# Patient Record
Sex: Male | Born: 1940 | Race: White | Hispanic: No | Marital: Single
Health system: Southern US, Community
[De-identification: ages and names within clinical notes are randomized; demographics above are authoritative.]

## PROBLEM LIST (undated history)

## (undated) DIAGNOSIS — N289 Disorder of kidney and ureter, unspecified: Secondary | ICD-10-CM

---

## 2008-07-18 ENCOUNTER — Ambulatory Visit: Payer: Self-pay | Admitting: Unknown Physician Specialty

## 2011-06-16 ENCOUNTER — Emergency Department: Payer: Self-pay | Admitting: Emergency Medicine

## 2011-07-02 ENCOUNTER — Ambulatory Visit: Payer: Self-pay | Admitting: Nephrology

## 2011-07-27 ENCOUNTER — Inpatient Hospital Stay: Payer: Self-pay | Admitting: Internal Medicine

## 2011-08-23 ENCOUNTER — Emergency Department (HOSPITAL_COMMUNITY)
Admission: EM | Admit: 2011-08-23 | Discharge: 2011-09-04 | Disposition: E | Payer: Medicare Other | Attending: Emergency Medicine | Admitting: Emergency Medicine

## 2011-08-23 ENCOUNTER — Encounter (HOSPITAL_COMMUNITY): Payer: Self-pay | Admitting: Emergency Medicine

## 2011-08-23 DIAGNOSIS — I469 Cardiac arrest, cause unspecified: Secondary | ICD-10-CM

## 2011-08-23 DIAGNOSIS — E119 Type 2 diabetes mellitus without complications: Secondary | ICD-10-CM | POA: Insufficient documentation

## 2011-08-23 HISTORY — DX: Disorder of kidney and ureter, unspecified: N28.9

## 2011-09-04 NOTE — ED Notes (Signed)
Family in room with pt

## 2011-09-04 NOTE — ED Notes (Signed)
Pt pronounced DOA.

## 2011-09-04 NOTE — ED Provider Notes (Signed)
History     CSN: [id number]  Arrival date & time 08/05/2011  1949   First MD Initiated Contact with Patient 09/01/2011 1956      Chief Complaint  Patient presents with  . Cardiac Arrest    (Consider location/radiation/quality/duration/timing/severity/associated sxs/prior treatment) HPI This 71 year old male has a history of diabetes and is usually cared for over in [place].  The patient had a witnessed collapse in the kitchen tonight and was found asystolic by EMS and first responders and the patient has remained asystolic despite over one hour of advanced cardiac life support with a King airway in place continuous compressions and multiple rounds of epinephrine.  The patient was pronounced dead upon arrival with asystole confirmed pupils fixed and dilated no spontaneous respirations no heart sounds no movement no pulses. Past Medical History  Diagnosis Date  . Diabetes mellitus   . Renal disorder    Diabetes History reviewed. No pertinent past surgical history.  No family history on file.  History  Substance Use Topics  . Smoking status: Not on file  . Smokeless tobacco: Not on file  . Alcohol Use:      unknown      Review of Systems  Unable to perform ROS: Other  Cardiac Arrest  Allergies  Sulfa antibiotics  Home Medications  No current outpatient prescriptions on file.  There were no vitals taken for this visit.  Physical Exam General Appearance: 71 year old obese unresponsive male with CPR in progress upon arrival Skin mottled HEENT exam: Pupils fixed and dilated absent corneal reflex absent gag reflex king airway is in place Neck:Supple Lungs: Bilateral breath sounds only with King airway ventilations, absent spontaneous respirations Cardiac: Chest compressions in progress upon arrival with asystole on the cardiac monitor with absent heart sounds are compressions stopped Abdomen:Obese, soft Ext: Flaccid, unresponsive Neuro:[facility] 3 ED Course  Procedures  (including critical care time) EMS care reviewed, asystole confirmed multiple leads, Pt pronounced DOA; d/w family and PCP; PCP will sign death certificate. Labs Reviewed - No data to display No results found.   1. Cardiac arrest - asystole    DOA   MDM          Hurman Horn, MD 08/11/2011 2355

## 2011-09-04 NOTE — ED Notes (Signed)
Patient pronounced DOA by Dr. Fonnie Jarvis.

## 2011-09-04 NOTE — ED Notes (Signed)
Patient was witness collapse in his kitchen at 1845; family started CPR within 5 minutes.  EMS continued CPR and intubation en route.  Patient was given Epi x 8 and Atropine x 3.  Patient asystole on monitor; Dr. Fonnie Jarvis at bedside on arrival.  Compressions continue.

## 2011-09-04 DEATH — deceased

## 2013-07-08 IMAGING — CT CT HEAD WITHOUT CONTRAST
2 series · 15 of 30 positions shown, 19 images · non-contrast
Comparison: none

REASON FOR EXAM: fall/syncope; pt on coumadin
COMMENTS:

[Series 2: without · axial · non-contrast · 0.47mm/px · z∈[+921,+1066]mm · 13 of 35 slices shown, 17 images]
[im 3/35  brain]
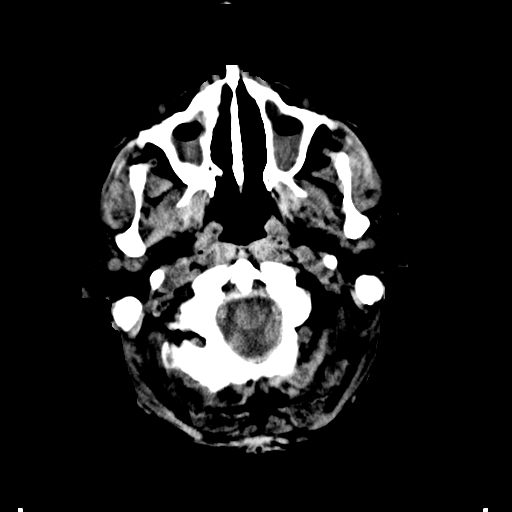
[im 3/35  bone]
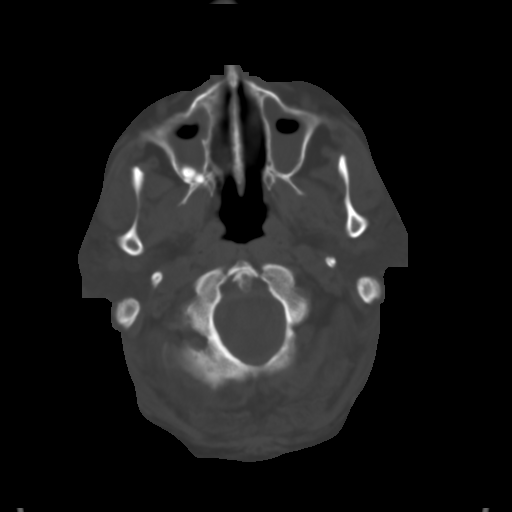
[im 5/35  brain]
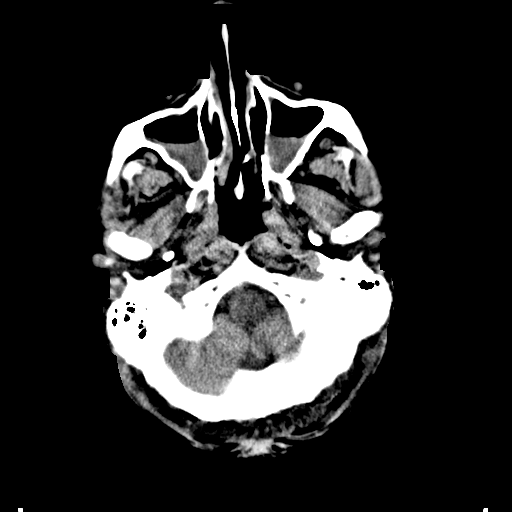
[im 8/35  brain]
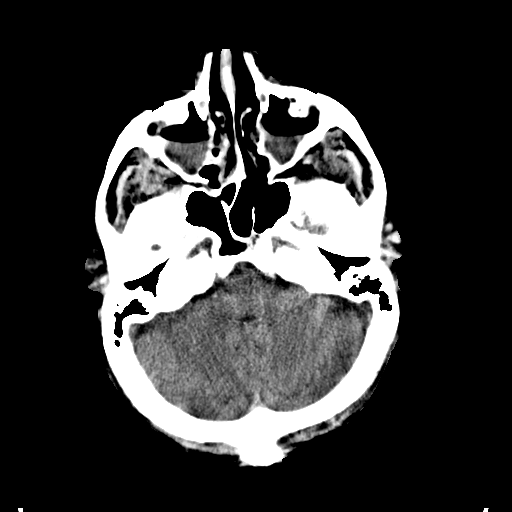
[im 10/35  brain]
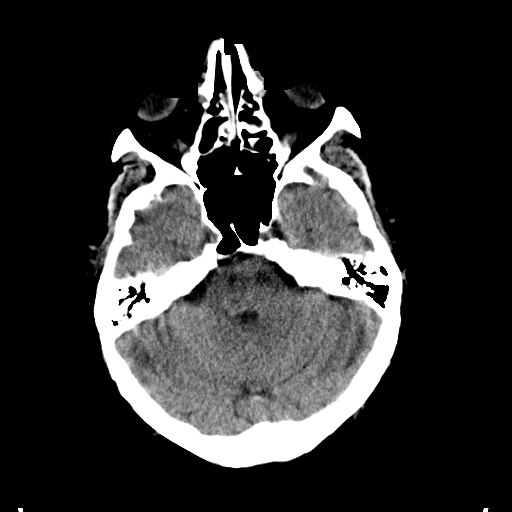
[im 13/35  brain]
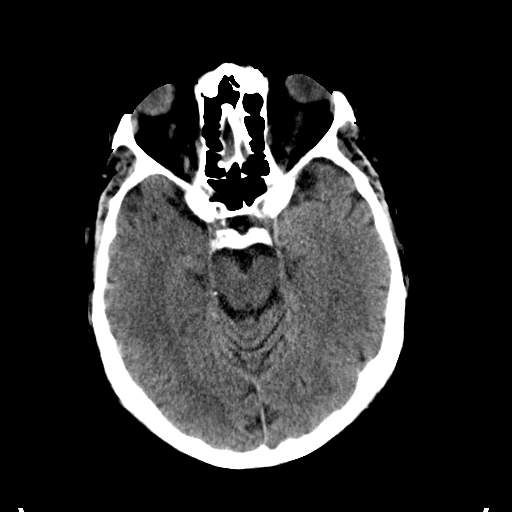
[im 13/35  bone]
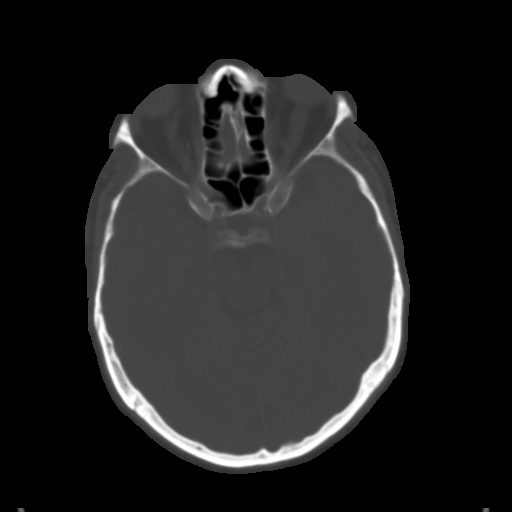
[im 15/35  brain]
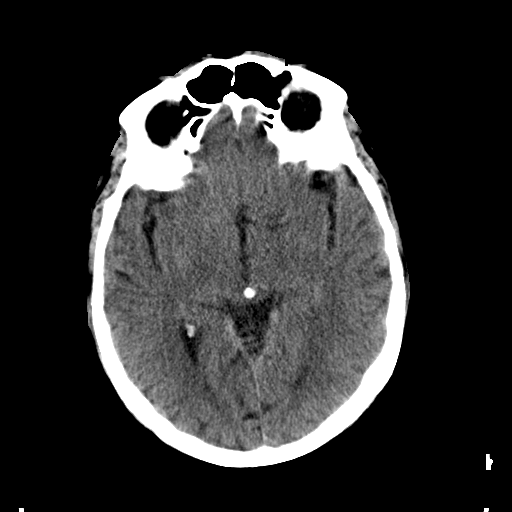
[im 18/35  brain]
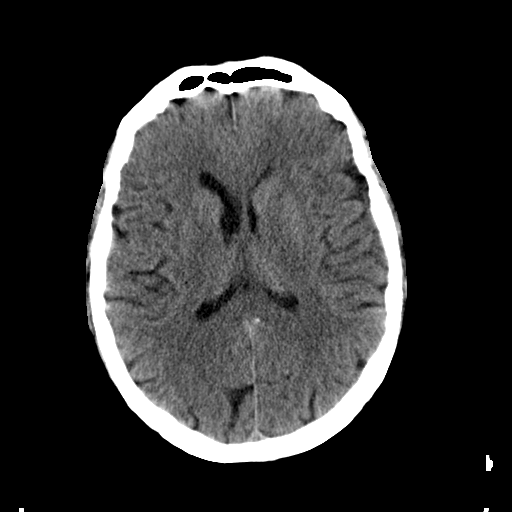
[im 20/35  brain]
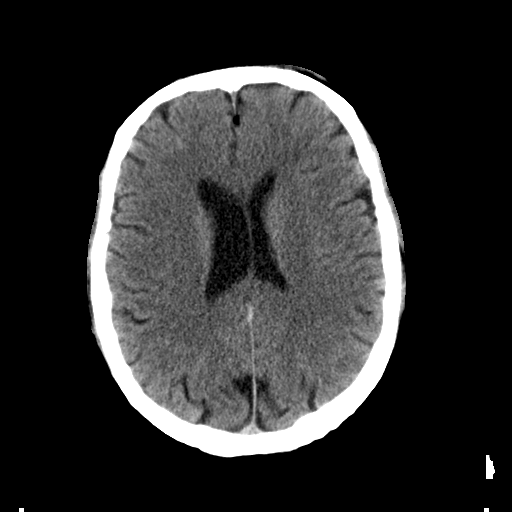
[im 22/35  brain]
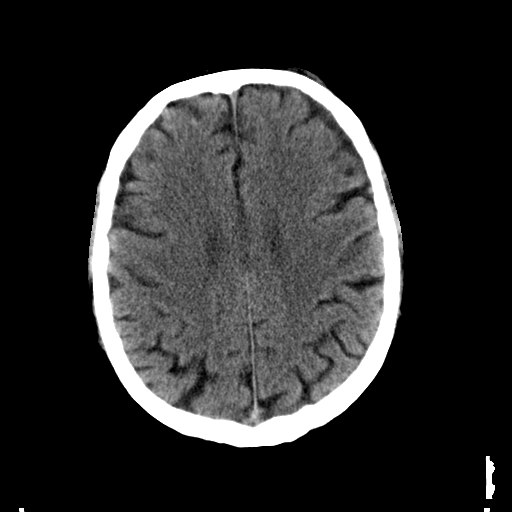
[im 22/35  bone]
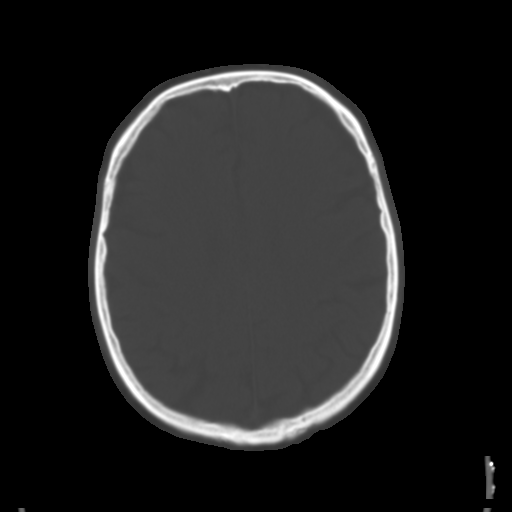
[im 25/35  brain]
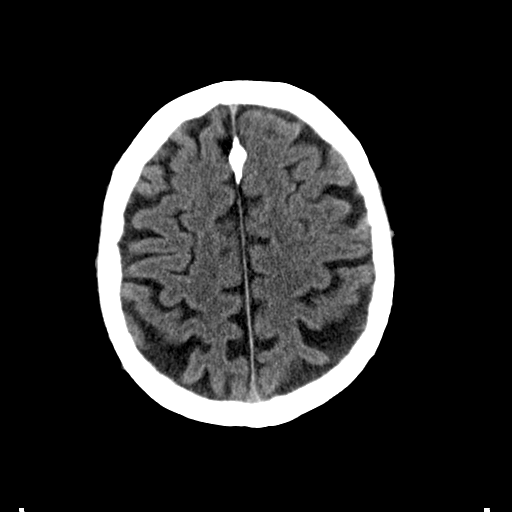
[im 27/35  brain]
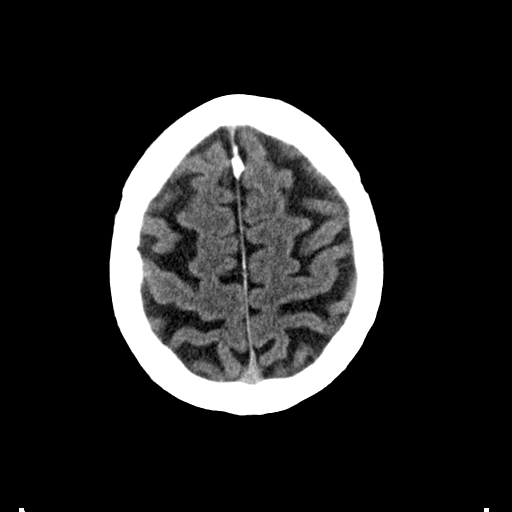
[im 30/35  brain]
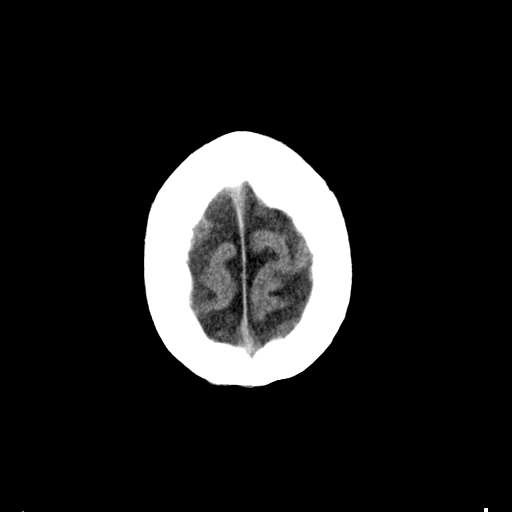
[im 32/35  brain]
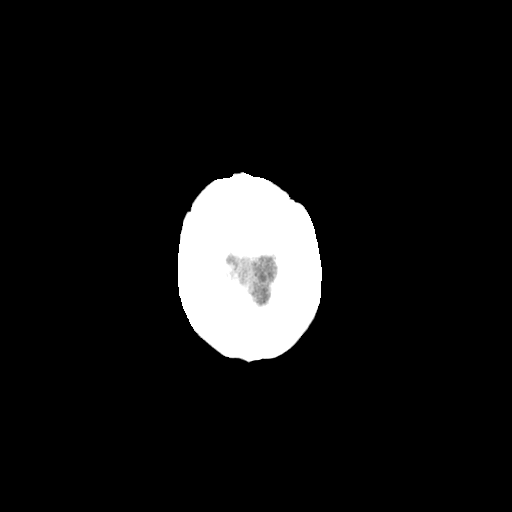
[im 32/35  bone]
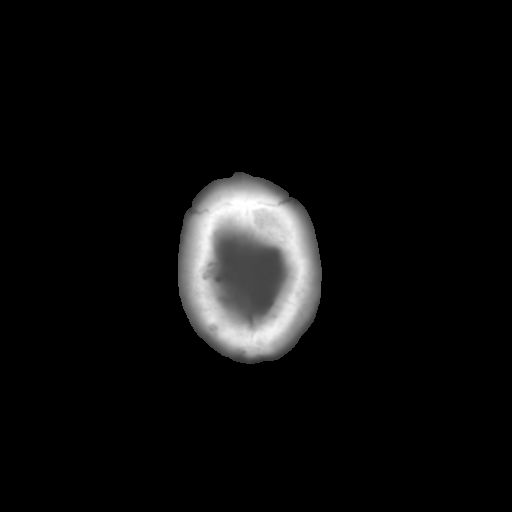

[Series 3: bone · axial · 0.47mm/px · z∈[+921,+946]mm · 2 of 35 slices shown]
[im 3/35  bone]
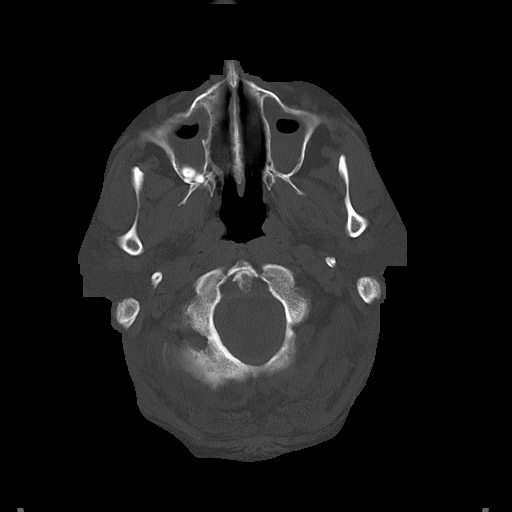
[im 8/35  bone]
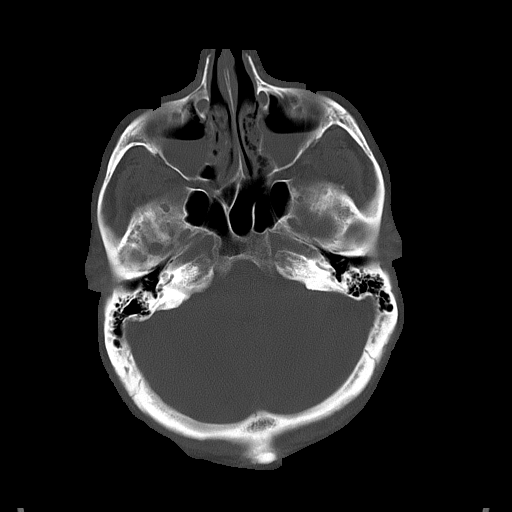

[15 of 30 positions shown; findings below may reference images not displayed]

PROCEDURE:     CT  - CT HEAD WITHOUT CONTRAST  - July 27, 2011 [DATE]

RESULT:     Axial noncontrast CT scanning was performed through the brain
with reconstructions at 5 mm intervals and slice thicknesses.

The ventricles are normal in size and position. There is no intracranial
hemorrhage nor intracranial mass effect. The cerebellum and brainstem are
normal in density.

At bone window settings there are air-fluid levels which exhibit Hounsfield
measurements of approximately 30 to 45. There are air-fluid levels in the
maxillary sinuses. There is mucoperiosteal thickening within the ethmoid
sinuses. I do not see evidence of fractures of the sinus walls. The nasal
bones are intact. There is no evidence of an acute skull fracture.
IMPRESSION: 1. I do not see evidence of an acute intracranial hemorrhage.
2. There are air-fluid levels in the maxillary sinuses and there is
mucoperiosteal thickening within the ethmoid sinuses. The findings are most
likely due to sinus inflammation.

## 2013-07-12 IMAGING — CR DG CHEST 2V
1 series · 3 of 3 positions shown · non-contrast
Comparison: none

REASON FOR EXAM: CHF
COMMENTS:

PROCEDURE:     DXR - DXR CHEST PA (OR AP) AND LATERAL  - July 31, 2011 [DATE]
RESULT:     Comparison: 07/27/2011

[Series 1: pa · 0.17mm/px · 3 of 3 slices shown]
[im 1/3]
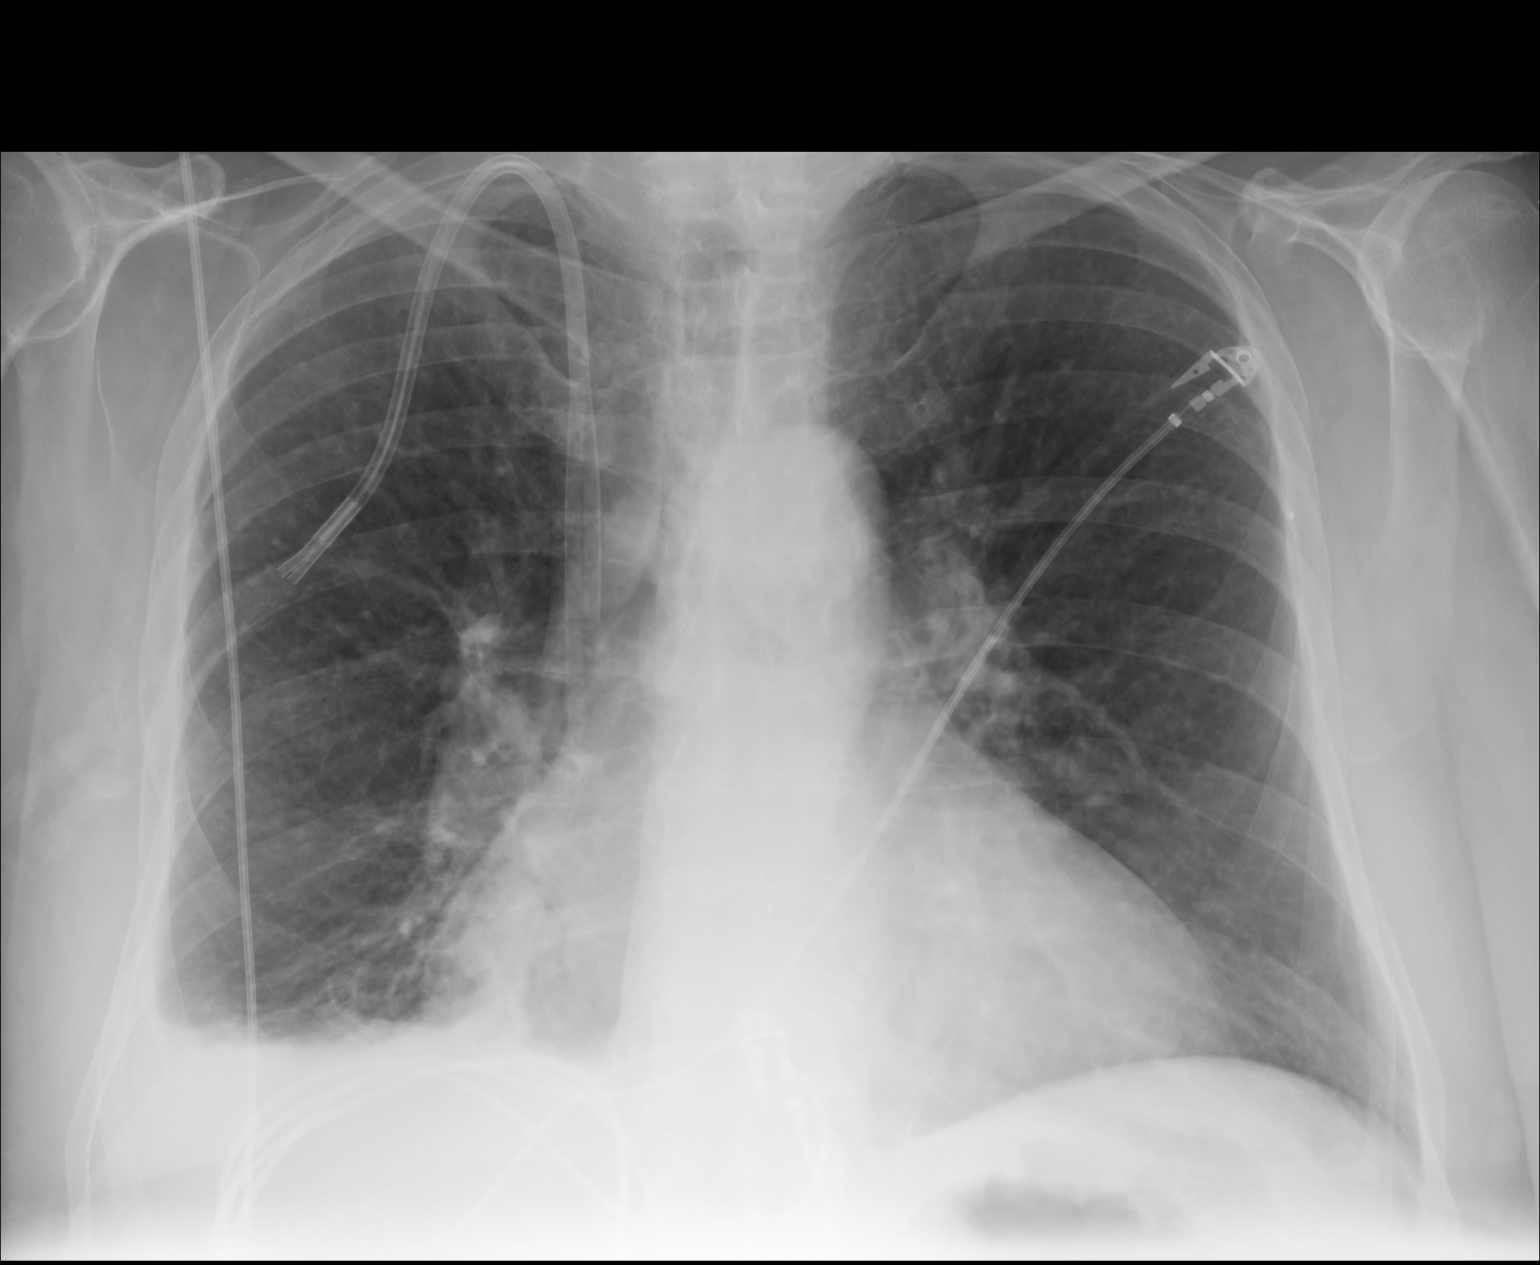
[im 2/3]
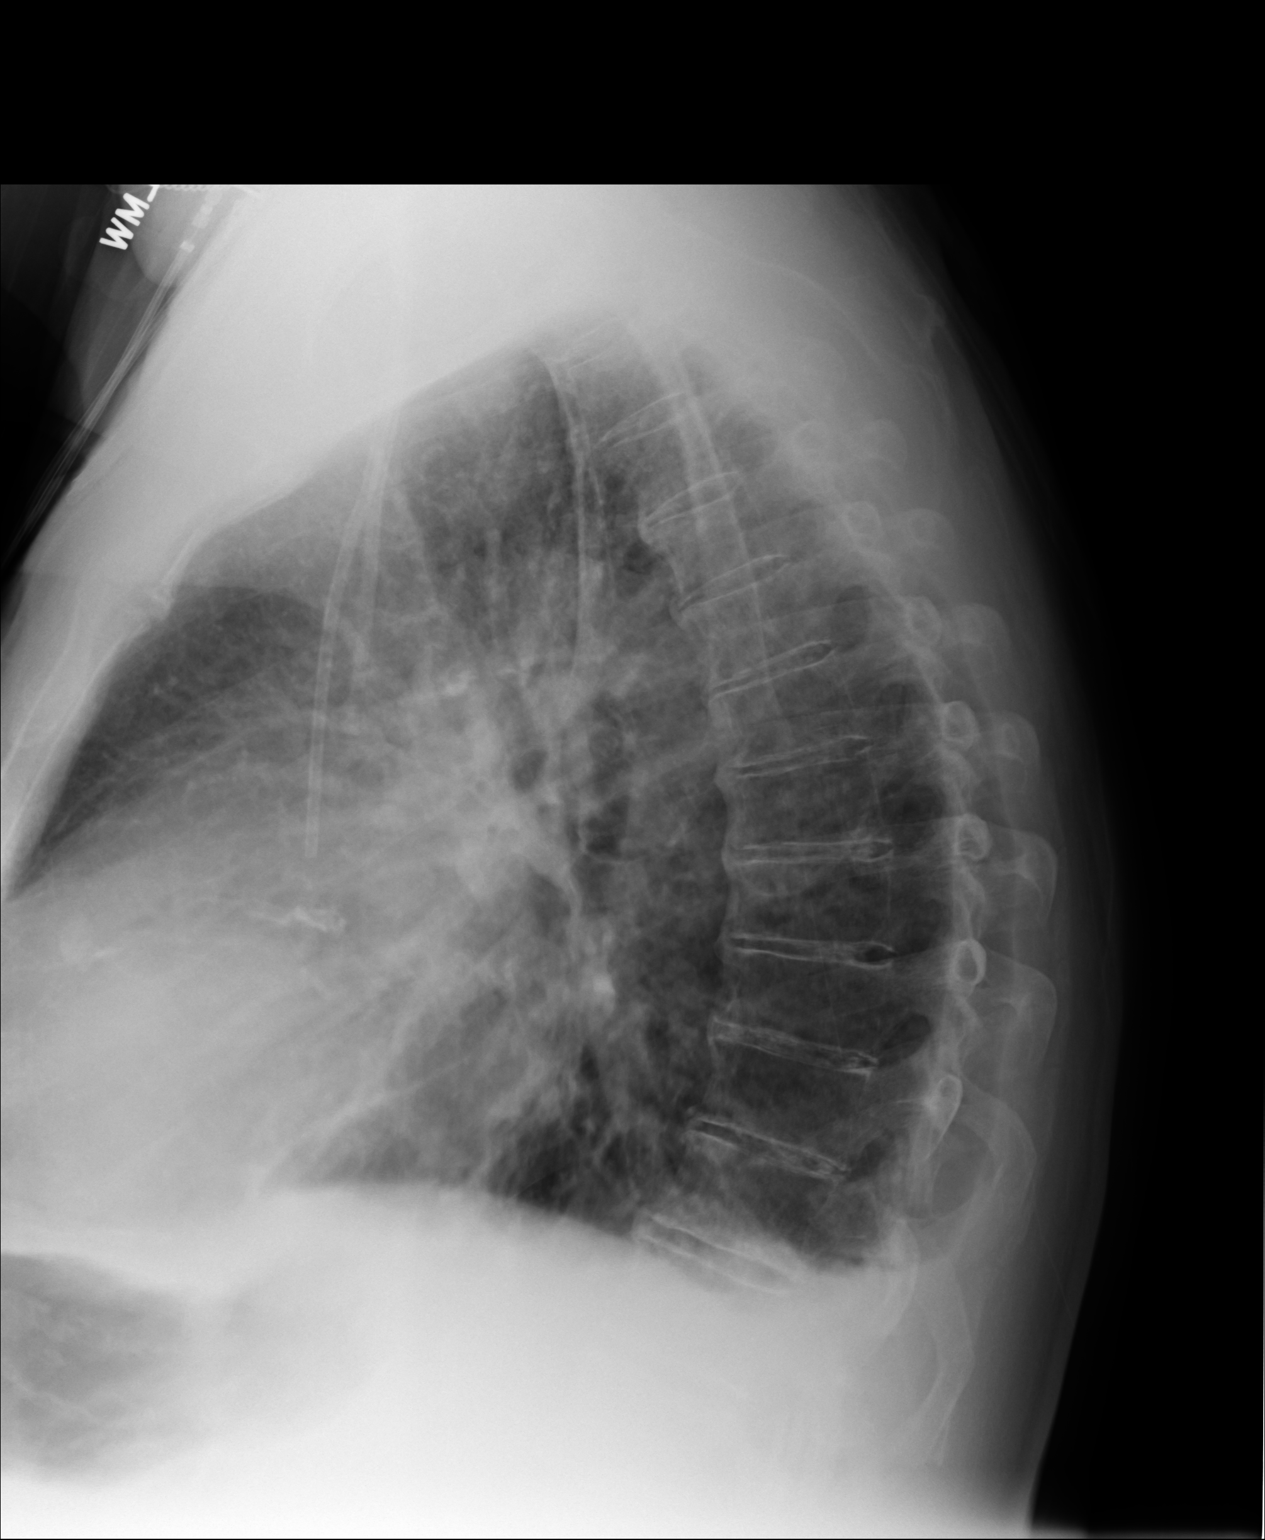
[im 3/3]
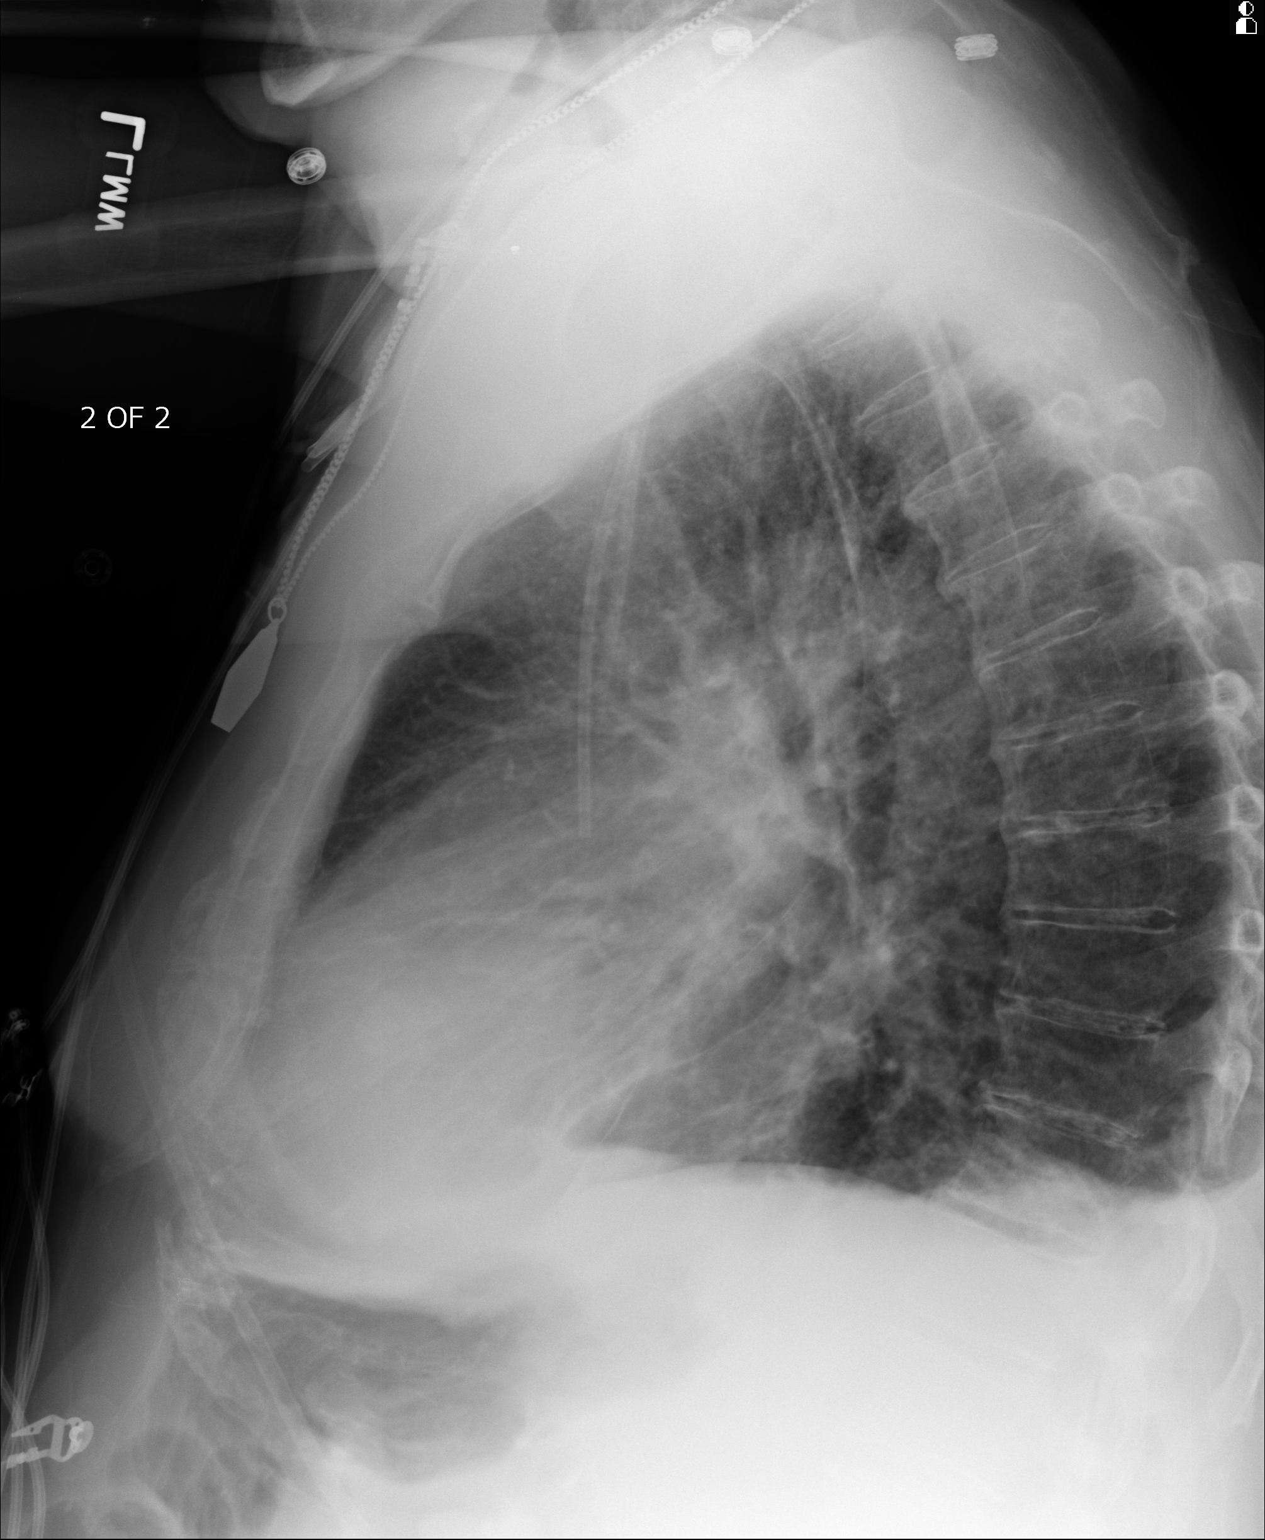

[3 of 3 positions shown; findings below may reference images not displayed]

FINDINGS: PA and lateral chest radiographs are provided. There is a small right
pleural effusion. There is a dual lumen right-sided central venous catheter.
The left lung is clear. The heart and mediastinum are unremarkable.  The
osseous structures are unremarkable.
IMPRESSION: Small right pleural effusion.

## 2014-11-25 NOTE — Discharge Summary (Signed)
PATIENT NAME:  Steven Ochoa, Steven Ochoa MR#:  [id number] DATE OF BIRTH:  Nov 25, 1940  DATE OF ADMISSION:  07/27/2011 DATE OF DISCHARGE:  08/03/2011  HISTORY OF PRESENT ILLNESS: Steven Ochoa is a 74 year old white gentleman who had been followed by nephrology for chronic renal insufficiency and had been having progressive deterioration in his condition. Over the days prior to admission he had been having increasing peripheral edema and shortness of breath. At the time of admission he could take no more than 2 or 3 steps without extreme dyspnea on exertion. He had developed a nonproductive cough. He had had no chills or fever. In the Emergency Room, he was found to have acute on chronic renal failure with what appeared to be pulmonary edema by exam and history. He also had a history of near syncope.   PAST MEDICAL HISTORY:  1. Type 2 diabetes. 2. Hyperlipidemia. 3. Coronary artery disease.  4. Stage III chronic renal disease. 5. Chronic atrial fibrillation. 6. Chronically elevated PSA.   ALLERGIES: Patient was noted to be allergic to sulfa drugs.   MEDICATIONS AT THE TIME OF ADMISSION:  1. Lantus insulin 10 units at bedtime. 2. Aspirin 325 mg daily.  3. Coumadin which was on hold at the time of admission due to a recently prolonged PT/INR.  4. Metformin 1000 mg b.i.d.  5. Actos 45 mg daily.  6. Zaroxolyn 2.5 mg daily.  7. Atenolol 50 mg b.i.d.  8. Lipitor 10 mg daily.  9. Amaryl 4 mg b.i.d.  10. TriCor 145 mg daily.  11. Lasix 40 mg daily.  12. Digoxin 0.25 mg daily.   ADMISSION PHYSICAL EXAMINATION: Weight 285 pounds. Blood pressure 138/78, temperature 97.7, pulse 73, pulse oximetry 90% on room air. Examination as described by the admitting physician revealed an obese male who appeared chronically fatigued. He had some bruising over the left eye where he had recently fallen after his syncopal episode. The neck was supple. Thyroid was not enlarged. Cardiac exam revealed an irregular, irregular  rhythm without murmur or gallop. Auscultation of the chest revealed crackles about one third of the way up over both lung fields. The abdomen was soft and nontender. Liver and spleen were not enlarged. Bowel sounds were normal. There was a question of some anasarca of the lower abdominal wall. Patient was noted to have 3+ peripheral edema. The remainder of the examination was basically unremarkable.   LABORATORY, DIAGNOSTIC AND RADIOLOGIC DATA: The patient's admission CBC showed a hemoglobin of 7.3 with a hematocrit of 22.8. White count was 10,000. Platelet count was 225,000. Admission comprehensive metabolic panel showed a blood sugar of 122, Steven Ochoa of 128, creatinine of 3.25, sodium of 134, potassium of 5.5, chloride of 97, calcium of 8.2, a total protein of 6.3, albumin of 3.2, and an estimated GFR of 20. Digoxin level was 1.57. TSH was 2.04. Initial troponin was weakly positive at 0.1. Random urine creatinine was 164.1. Random urine protein was 47. Urinalysis showed 30 mg/dL of protein but was otherwise unremarkable. Protein immunoelectrophoresis of urine showed a total protein of 47.5. Urine for eosinophils was negative. Cortisol on admission was 29. Admission electrocardiogram showed atrial fibrillation with incomplete left bundle branch block. There were nonspecific ST-T wave changes present. CT scan of the head without contrast done in the Emergency Room showed no acute changes. There were changes consistent with sinus inflammation noted bilaterally. Admission chest x-ray showed bilateral perihilar infiltrates. Renal ultrasound showed a cyst within the right kidney but was otherwise unremarkable.   HOSPITAL  COURSE: The patient was admitted to the regular medical floor where he was placed on telemetry. He had previously had an echocardiogram that showed normal cardiac function which seemed to make cardiac disease an unlikely source of his pulmonary edema. On admission his Actos and metformin were held. The  dose of his Amaryl was reduced. Lantus was put on suspend. He was covered with a sliding scale. The patient was seen in consultation by nephrology who noted that the patient thought all of his problems started with the fact that he got an influenza vaccination. They started him on IV Lasix for his congestive heart failure. They felt that his kidney disease was consistent with familiar disease. They did not think that the influenza vaccine was source of the problem. They did feel that it was a combination of damage from hypertension and diabetes. He was started on antibiotics to cover the possibility of a pulmonary infection in addition to heart failure. By treating with diuretics, the patient's Steven Ochoa and creatinine actually rose. Basic metabolic panel done on 12/25 showed a Steven Ochoa of 130 with a creatinine of 3.78. The patient was seen in consultation by vascular surgery and a shunt was placed for temporary dialysis. The patient did eventually receive dialysis x2 to eliminate his vascular overload. On 12/26 the Steven Ochoa was down to 56 with a creatinine of 1.52. His estimated GFR at that point was up to 48. The patient remained somewhat lethargic and seemed confused despite the improvement in his renal function. He was noted to have some problems with transient hypoglycemia. He was eventually placed on sliding scale only because of recurrent hypoglycemia. It was felt that the Amaryl was hanging around due to his renal failure. After several hemodialysis applications the patient's kidney function continued to improve and he started to have some urine output. The patient was transfused with 2 units of packed cells during his hospitalization due to his anemia. In fact, he known underlying heart disease. He was also seen in consultation by cardiology during his hospitalization who felt that his congestive heart failure was related to his kidney function and not of cardiac origin. With repeated dialysis and transfusion, the patient  did begin to show gradual improvement. At the time of discharge his Steven Ochoa was 48 with a creatinine 1.72. Protein was 6.7. Albumin was 3.1. Estimated GFR was 42. Follow-up chest x-ray showed mild interstitial edema but overall improvement. Pulse oximetry on the day of discharge on room air was 94%.   DISCHARGE DIAGNOSES:  1. Acute on chronic renal failure.  2. Congestive heart failure secondary to #1. 3. Hypertension.  4. Diabetes.  5. Coronary artery disease.  6. Chronic atrial fibrillation.   PROCEDURE: Dialysis.   DISCHARGE MEDICATIONS:  1. Atenolol 50 mg b.i.d.  2. Lipitor 10 mg daily.  3. TriCor 145 mg daily.  4. Coumadin 5 mg on Monday, Wednesday and Friday.  5. Coumadin 2.5 mg on Sunday, Tuesday, Thursday, and Saturday.  6. Aspirin 325 mg daily.  7. Diovan 40 mg daily.  8. NovoLog 15 units subcutaneous t.i.d. before meals.  9. Levaquin 250 mg daily for an additional seven days.   NOTE: The patient was advised to discontinue his Lantus insulin, Actos, metformin, Amaryl, digoxin, furosemide, and Zaroxolyn.   DISCHARGE INSTRUCTIONS: Patient was discharged on a low sodium, ADA diet with activity as tolerated. He is to be followed up in the office in 1 to 2 weeks.   ____________________________ Letta PateJohn B. Danne HarborWalker III, MD jbw:cms D: 08/31/2011 21:59:04 ET  T: 09/01/2011 09:31:24 ET JOB#: [id number]  cc: Letta Pate. Danne Harbor, MD, <Dictator> Elmo Putt III MD ELECTRONICALLY SIGNED 09/02/2011 20:39

## 2014-11-25 NOTE — Op Note (Signed)
PATIENT NAME:  [name], Steven Ochoa DATE OF BIRTH:  [birth year]  DATE OF PROCEDURE:  07/29/2011  PREOPERATIVE DIAGNOSIS: End-stage renal disease requiring hemodialysis.   POSTOPERATIVE DIAGNOSIS: End-stage renal disease requiring hemodialysis.   PROCEDURE PERFORMED: Insertion of right IJ cuffed tunneled dialysis catheter with ultrasound guidance.   SURGEON: Renford DillsGregory G. Samanvitha Germany, MD   SEDATION: Versed 2 mg plus fentanyl 100 mcg administered IV. Continuous ECG, pulse oximetry, and cardiopulmonary monitoring was performed throughout the entire procedure by the interventional radiology nurse. Total sedation time is 30 minutes.   ACCESS: Right IJ.   CONTRAST: None.   FLUORO TIME: Approximately three minutes.   INDICATIONS: Steven Ochoa is a 74 year old gentleman who has progressed from chronic renal insufficiency to acute renal failure. He is now undergoing placement of a cuffed tunneled dialysis catheter to initiate his hemodialysis. Risks and benefits were described to the patient with family in attendance. All questions are answered. All are in agreement with proceeding.   PROCEDURE: The patient is taken to Special Procedures and placed in the supine position. After adequate sedation is achieved, the right neck and chest wall are prepped and draped in a sterile fashion. Collier Flowersoban is placed over the entire surface to prevent skin contamination. Ultrasound is placed in a sterile sleeve. Ultrasound is utilized secondary to lack of appropriate landmarks to avoid vascular injury. Under direct ultrasound visualization, jugular vein is identified. It is echolucent, homogeneous, quite large. Image is recorded for the permanent record. It is easily compressible indicating patency. Under direct ultrasound visualization, micropuncture needle is inserted into the jugular vein. This is under continuous ultrasound guidance. J-wire is then advanced followed by counterincision with an 11 blade. Micro sheath,  J-wire is then advanced under fluoroscopic guidance into the inferior vena cava. Serial dilatation is performed over the wire and subsequently the dilator and peel-away sheath is inserted, Wire and dilator are removed and a 19 cm tip-to-cuff Cannon catheter is inserted.   Fluoroscopy is used to position the tips at the atriocaval junction and an approximation on the chest wall for the exit site is anesthetized with 1% lidocaine with epinephrine. Small incision is made, dilator is passed subcutaneously, and the catheter is pulled through the subcutaneous tunnel. Hub assembly is connected. Both lumens aspirate and flush quite easily. Both lumens are packed with 5000 units of heparin per lumen. Neck counterincision is closed with a 4-0 Monocryl subcuticular and then Dermabond. Catheter is secured to the chest wall with 0 silk. Sterile dressing is applied. The patient tolerated the procedure well and there were no immediate complications.   ____________________________ Renford DillsGregory G. Rhian Funari, MD ggs:drc D: 07/29/2011 10:48:00 ET T: 07/29/2011 13:19:36 ET JOB#: [id number]  cc: Renford DillsGregory G. Caylor Tallarico, MD, <Dictator> Laverda SorensonSarath C. Kolluru, MD Renford DillsGREGORY G Youlanda Tomassetti MD ELECTRONICALLY SIGNED 08/05/2011 16:45

## 2014-11-25 NOTE — Consult Note (Signed)
PATIENT NAME:  Steven Ochoa, Steven Ochoa MR#:  [id number] DATE OF BIRTH:  1941/06/09  DATE OF CONSULTATION:  07/30/2011  REFERRING PHYSICIAN:  Dr. Dan Humphreys  CONSULTING PHYSICIAN:  Marcina Millard, MD  CHIEF COMPLAINT: "My legs are weak."  HISTORY OF PRESENT ILLNESS: Patient is a 74 year old gentleman who was admitted on 07/27/2011 with fluid retention and acute on chronic renal failure. The patient apparently has had a recent history of increasing fluid retention manifested with pedal edema and shortness of breath. He was also noted to have elevated BUN and creatinine. Recent echocardiogram showed normal left ventricular function. Approximately a week ago the patient was lightheaded, fell and had a large bruise on his face but did not seek medical attention. He was on Coumadin. INR was 5 and Coumadin was held. Other laboratories revealed acute on chronic renal failure with a BUN and creatinine of 130 and 3.78. Patient was admitted to telemetry. Other notable labs were borderline elevated troponin of 0.09. Patient denies chest pain. He underwent hemodialysis yesterday with clinical improvement.   PAST MEDICAL HISTORY:  1. Status post myocardial infarction in 1990 at [facility].  2. Chronic atrial fibrillation.  3. Hyperlipidemia.  4. Diabetes.  5. Stage III chronic kidney disease.   MEDICATIONS ON ADMISSION:  1. Coumadin is withheld. 2. Aspirin 325 mg daily.  3. Lasix 40 mg daily.  4. Metolazone 2.5 mg daily.  5. Atenolol 50 mg b.i.d.  6. Lipitor 10 mg at bedtime.  7. TriCor 145 mg daily.  8. Digoxin 0.25 mg daily.  9. Lantus 10 units at bedtime.  10. Metformin 1000 mg b.i.d.  11. Amaryl 4 mg b.i.d.   SOCIAL HISTORY: Patient currently lives alone. He smokes approximately a pack of cigarettes a day.   FAMILY HISTORY: No immediate family history of coronary artery disease or myocardial infarction.    REVIEW OF SYSTEMS: CONSTITUTIONAL: No fever or chills. EYES: No blurry  vision. EARS: No hearing loss. RESPIRATORY: Patient does have shortness of breath and orthopnea. CARDIOVASCULAR: Patient denies chest pain. GASTROINTESTINAL: Patient denies nausea, vomiting, diarrhea, constipation. GENITOURINARY: Patient denies dysuria, hematuria. ENDOCRINE: Patient denies polyuria or polydipsia. INTEGUMENT: Patient has a bruise over his left eye. NEUROLOGICAL: Patient denies focal muscle weakness or numbness. NEUROLOGICAL: Patient denies depression or anxiety.   PHYSICAL EXAMINATION:  VITAL SIGNS: Blood pressure 124/66, pulse 85, respirations 20, temperature 97.9, pulse oximetry 91%.   HEENT: Pupils equal, reactive to light and accommodation.   NECK: Supple without thyromegaly.   LUNGS: Decreased breath sounds in both bases.   HEART: Normal jugular venous pressure. Normal point of maximal impulse. Regular rate, rhythm. Normal S1, S2. No appreciable gallop, murmur, rub.   ABDOMEN: Soft, nontender without hepatosplenomegaly.   EXTREMITIES: There is trace to 1+ bilateral pedal edema.   MUSCULOSKELETAL: Normal muscle tone.   NEUROLOGIC: Patient is alert and oriented x3. Motor and sensory are both grossly intact.   IMPRESSION: 74 year old gentleman who presents with acute on chronic renal failure with pedal edema and shortness of breath. Patient has normal left ventricular function. Patient has demonstrated clinical improvement after hemodialysis. He is scheduled for a second hemodialysis today.   RECOMMENDATIONS:  1. Agree with overall current therapy.  2. Continue to hold Coumadin for now.  3. Defer full dose anticoagulation especially in light of recent fall.  4. Hold Lasix and metolazone. 5. Hold digoxin.  6. May consider adding low dose beta blocker in light of patient's history of atrial fibrillation and coronary artery disease.  7. No further noninvasive or invasive cardiac evaluation at this time.  ____________________________ Marcina MillardAlexander Nycholas Rayner,  MD ap:cms D: 07/30/2011 11:02:58 ET T: 07/30/2011 11:32:49 ET JOB#: [id number]  cc: Marcina MillardAlexander Mahin Guardia, MD, <Dictator> Marcina MillardALEXANDER Basil Buffin MD ELECTRONICALLY SIGNED 08/15/2011 10:04

## 2014-11-25 NOTE — H&P (Signed)
PATIENT NAME:  Steven Ochoa, Steven Ochoa MR#:  [id number] DATE OF BIRTH:  1940/10/10  DATE OF ADMISSION:  07/27/2011  CHIEF COMPLAINT: Weakness, cough, edema, syncope.   HISTORY OF PRESENT ILLNESS: 74 year old male with history of type 2 diabetes, history of coronary artery disease with prior myocardial infarction, history of chronic kidney disease stage III, and history of atrial fibrillation on Coumadin. Over the last month or so he has been having increasing difficulty with leg edema, some nonproductive cough. He had been noted to have rising BUN and creatinine, BUN 16, creatinine 2.3 back in November with follow-up labs about four days ago showing BUN of 90. He had been seen by nephrology on the 26th and underwent echocardiogram, showing normal LV function, and reportedly renal ultrasound, though I cannot find report of this. He had an episode of syncope on Sunday, reports that he felt lightheaded and was out for possibly several hours. He has got a large bruise over his left face, but he did not come in to seek help. He had been on Coumadin, and his INR had been greater than 5 at last check. He has now progressed to the point that he has swelling up into his thighs. He reports that he cannot walk more than 2 or 3 steps before becoming extremely short of breath. His cough continues, without fevers, chills. He is admitted now with acute renal failure, what appears to be pulmonary edema by examination and by history, as well as recent syncopal episode.   PAST MEDICAL HISTORY:  1. Adult onset diabetes mellitus.  2. Hyperlipidemia.  3. Coronary artery disease with prior myocardial infarction. Echocardiogram 06/2011 showing preserved LV function without valvular heart disease.  4. Chronic kidney disease, stage III.  5. History of elevated prostate specific antigen.  6. Atrial fibrillation.   ALLERGIES: Sulfa.   MEDICATIONS:  1. Lantus 10 units subcutaneous at bedtime.  2. Aspirin 325 mg daily.   3. Coumadin, recently held.  4. Metformin 1000 mg p.o. b.i.d.  5. Actos 45 mg p.o. daily.  6. Metolazone 2.5 mg p.o. daily.  7. Atenolol 50 mg p.o. b.i.d., which was recently supposed to be reduced.  8. Lipitor 10 mg p.o. at bedtime.  9. Amaryl 4 mg p.o. b.i.d.  10. TriCor 145 mg p.o. daily.  11. Lasix 40 mg p.o. daily.  12. Digoxin 0.25 mg p.o. daily.   SOCIAL HISTORY: Remote tobacco. No alcohol.   FAMILY HISTORY: Breast cancer in mother. Father with bone marrow cancer. Cousin with colon cancer.   REVIEW OF SYSTEMS: Please see history of present illness. Generalized weakness. No chest pain or palpitations. Denies fevers, chills. Some headache, but not severe. No focal numbness or weakness. Remainder of complete review of systems is negative.   PHYSICAL EXAMINATION:  VITALS: Weight 285, blood pressure 138/78, temperature 97.7, pulse 73, saturation 90% on room air.   GENERAL: Obese male, appears fatigued.   EYES: Bruising over the left eye. Lids and conjunctiva otherwise intact.   ENT: External examination unremarkable. Oropharynx moist without lesions.   NECK: Supple. Trachea in the midline. No thyromegaly.   CARDIOVASCULAR: Irregularly irregular without gallops or rubs. Pulses are 2+ in the carotids and radials.   LUNGS: Absent breath sounds in the bases with some crackles at about a third of the way up. No wheeze or retractions.   ABDOMEN: Soft, distended, positive bowel sounds. Mildly firm along the belt line without guard, rebound, or focal tenderness.   SKIN: Bruising as noted above. No other  significant rashes or nodules.    LYMPH NODES: No cervical or supraclavicular nodes.   MUSCULOSKELETAL: No clubbing or cyanosis. 3+ edema is noted extending into the thighs bilaterally. Negative Homans sign.   NEUROLOGIC: Cranial nerves appear to be intact. Motor strength appears to be grossly symmetrical, with generalized weakness in the legs. Gait is not tested.   IMPRESSION  AND PLAN:  1. Acute renal failure. Previous BUN and creatinine had continued to climb despite use of Lasix and he appears to be fluid overloaded, now with symptomatic edema. Will get nephrology consultation. Will obtain renal ultrasound if this was not performed, and will need a bladder scan as well to make sure he is not obstructed as his significant azotemia does suggest this. His normal echocardiogram indicates that this does not appear to be a cardiac problem in terms of moving fluid to the kidneys.  2. Diabetes mellitus. Will hold Actos, hold metformin. Reduce Amaryl dose. Hold Lantus for now. Cover with sliding scale insulin and adjust.  3. Cough. I suspect this is related to probable pulmonary edema. Will await his chest x-ray. Tessalon Perles for now. No clear indication of fevers, chills. Will see what his white blood cell count looks like.  4. Coronary artery disease. Will hold aspirin for now until we see what his renal function looks like as well. His Coumadin being held as noted above. Will check his digoxin level and hold on this, make he has not got dig toxicity as source of his syncope as well.   ____________________________ Lynnea FerrierBert J. Truth Wolaver III, MD bjk:cms D: 07/27/2011 09:39:42 ET T: 07/27/2011 10:55:36 ET JOB#: [id number]  cc: Lynnea FerrierBert J. Quantavius Humm III, MD, <Dictator> Daniel NonesBERT Kaidynce Pfister MD ELECTRONICALLY SIGNED 08/05/2011 7:50

## 2014-11-25 NOTE — Op Note (Signed)
PATIENT NAME:  Steven Ochoa, Steven Ochoa MR#:  [id number] DATE OF BIRTH:  09/08/1940  DATE OF PROCEDURE:  08/01/2011  PREOPERATIVE DIAGNOSES:  1. Renal failure with improvement in renal function.  2. No further need for dialysis.   POSTOPERATIVE DIAGNOSES: 1. Renal failure with improvement in renal function.  2. No further need for dialysis.   PROCEDURE: Removal of right jugular PermCath.   SURGEON: Annice NeedyJason S. Dew, MD   ANESTHESIA: Local.   ESTIMATED BLOOD LOSS: Minimal.   INDICATION FOR PROCEDURE: This is a 74 year old white male who had a PermCath placed for renal failure earlier on this admission. He has had return of his renal function and no longer needs dialysis so his catheter needs to be removed. Risks and benefits were discussed. Informed consent was obtained.   DESCRIPTION OF PROCEDURE: The patient was laid in his floor bed. The area was sterilely prepped and locally anesthetized with 1% lidocaine. Hemostats were used to help free the adhesions. All the sutures were removed securing the catheter to the chest wall and the catheter was then removed with gentle traction in its entirety. Pressure was held on the site and sterile dressing was placed. The patient tolerated the procedure well.   ____________________________ Annice NeedyJason S. Dew, MD [email or web address]:drc Ochoa: 08/01/2011 12:58:07 ET T: 08/01/2011 13:45:12 ET JOB#: [id number]  cc: Annice NeedyJason S. Dew, MD, <Dictator> Annice NeedyJASON S DEW MD ELECTRONICALLY SIGNED 08/21/2011 7:55
